# Patient Record
Sex: Female | Born: 1960
Health system: Southern US, Community
[De-identification: ages and names within clinical notes are randomized; demographics above are authoritative.]

## PROBLEM LIST (undated history)

## (undated) DIAGNOSIS — D229 Melanocytic nevi, unspecified: Secondary | ICD-10-CM

## (undated) DIAGNOSIS — F419 Anxiety disorder, unspecified: Secondary | ICD-10-CM

## (undated) DIAGNOSIS — I10 Essential (primary) hypertension: Secondary | ICD-10-CM

## (undated) DIAGNOSIS — E7800 Pure hypercholesterolemia, unspecified: Secondary | ICD-10-CM

## (undated) DIAGNOSIS — K219 Gastro-esophageal reflux disease without esophagitis: Secondary | ICD-10-CM

## (undated) HISTORY — PX: FRACTURE SURGERY: SHX138

## (undated) HISTORY — PX: CHOLECYSTECTOMY: SHX55

## (undated) HISTORY — DX: Melanocytic nevi, unspecified: D22.9

---

## 1997-08-19 ENCOUNTER — Ambulatory Visit (HOSPITAL_COMMUNITY): Admission: RE | Admit: 1997-08-19 | Discharge: 1997-08-19 | Payer: Self-pay | Admitting: Obstetrics and Gynecology

## 1997-09-24 ENCOUNTER — Other Ambulatory Visit: Admission: RE | Admit: 1997-09-24 | Discharge: 1997-09-24 | Payer: Self-pay | Admitting: *Deleted

## 1998-10-15 ENCOUNTER — Other Ambulatory Visit: Admission: RE | Admit: 1998-10-15 | Discharge: 1998-10-15 | Payer: Self-pay | Admitting: Obstetrics and Gynecology

## 1999-02-21 ENCOUNTER — Inpatient Hospital Stay (HOSPITAL_COMMUNITY): Admission: AD | Admit: 1999-02-21 | Discharge: 1999-02-21 | Payer: Self-pay | Admitting: Obstetrics & Gynecology

## 1999-04-02 ENCOUNTER — Inpatient Hospital Stay (HOSPITAL_COMMUNITY): Admission: AD | Admit: 1999-04-02 | Discharge: 1999-04-02 | Payer: Self-pay | Admitting: Obstetrics and Gynecology

## 1999-06-10 ENCOUNTER — Encounter (INDEPENDENT_AMBULATORY_CARE_PROVIDER_SITE_OTHER): Payer: Self-pay

## 1999-06-10 ENCOUNTER — Inpatient Hospital Stay (HOSPITAL_COMMUNITY): Admission: AD | Admit: 1999-06-10 | Discharge: 1999-06-13 | Payer: Self-pay | Admitting: Obstetrics and Gynecology

## 1999-08-03 ENCOUNTER — Other Ambulatory Visit: Admission: RE | Admit: 1999-08-03 | Discharge: 1999-08-03 | Payer: Self-pay | Admitting: Obstetrics and Gynecology

## 2000-02-28 ENCOUNTER — Emergency Department (HOSPITAL_COMMUNITY): Admission: EM | Admit: 2000-02-28 | Discharge: 2000-02-28 | Payer: Self-pay | Admitting: Emergency Medicine

## 2000-03-07 ENCOUNTER — Encounter: Admission: RE | Admit: 2000-03-07 | Discharge: 2000-03-07 | Payer: Self-pay | Admitting: Internal Medicine

## 2000-10-10 ENCOUNTER — Other Ambulatory Visit: Admission: RE | Admit: 2000-10-10 | Discharge: 2000-10-10 | Payer: Self-pay | Admitting: Obstetrics and Gynecology

## 2001-11-17 ENCOUNTER — Other Ambulatory Visit: Admission: RE | Admit: 2001-11-17 | Discharge: 2001-11-17 | Payer: Self-pay | Admitting: Obstetrics and Gynecology

## 2003-01-10 ENCOUNTER — Other Ambulatory Visit: Admission: RE | Admit: 2003-01-10 | Discharge: 2003-01-10 | Payer: Self-pay | Admitting: Obstetrics and Gynecology

## 2004-02-03 ENCOUNTER — Other Ambulatory Visit: Admission: RE | Admit: 2004-02-03 | Discharge: 2004-02-03 | Payer: Self-pay | Admitting: Obstetrics and Gynecology

## 2004-08-13 ENCOUNTER — Other Ambulatory Visit: Admission: RE | Admit: 2004-08-13 | Discharge: 2004-08-13 | Payer: Self-pay | Admitting: Obstetrics and Gynecology

## 2005-07-21 ENCOUNTER — Other Ambulatory Visit: Admission: RE | Admit: 2005-07-21 | Discharge: 2005-07-21 | Payer: Self-pay | Admitting: Obstetrics and Gynecology

## 2005-11-05 ENCOUNTER — Ambulatory Visit (HOSPITAL_COMMUNITY): Admission: RE | Admit: 2005-11-05 | Discharge: 2005-11-05 | Payer: Self-pay | Admitting: Family Medicine

## 2007-11-10 ENCOUNTER — Encounter: Admission: RE | Admit: 2007-11-10 | Discharge: 2007-11-10 | Payer: Self-pay | Admitting: Gastroenterology

## 2010-10-08 ENCOUNTER — Ambulatory Visit: Payer: 59 | Attending: Otolaryngology

## 2010-10-08 DIAGNOSIS — G4733 Obstructive sleep apnea (adult) (pediatric): Secondary | ICD-10-CM | POA: Insufficient documentation

## 2010-10-14 NOTE — Procedures (Signed)
Amanda Wheeler, Amanda Wheeler              ACCOUNT NO.:  [id number]  MEDICAL RECORD NO.:  [id number]          PATIENT TYPE:  OUT  LOCATION:  SLEEP LAB                     FACILITY:  [facility]  PHYSICIAN:  Kolette Vey A. Gerilyn Pilgrim, M.D.      DATE OF BIRTH:  DATE OF STUDY:  10/08/2010                           NOCTURNAL POLYSOMNOGRAM  REFERRING PHYSICIAN:  INDICATIONS:  This is a 51 year old  female who presents with hypersomnia, fatigue and snoring.  She has been diagnosed with obstructive sleep apnea syndrome.  This is a CPAP titration recording.  INDICATION FOR STUDY:  EPWORTH SLEEPINESS SCORE:  MEDICATIONS:  Metoprolol.  Epworth sleepiness scale 16.  BMI 25. Architecture summary:  The total recording time is 396 minutes.  Sleep efficiency 75%,  sleep latency 18 minutes.  REM latency 101 minutes.  Stage N1-5%, N2 63%, N317% and REM sleep 15%.  Respiratory: Baseline oxygen saturation 98, lowest saturation 90 during REM sleep.  The patient was started on CPAP with a pressure of 5 and titrated to a pressure of 12.  She did have difficulty going to sleep and awakening during the later part of the night.  She was subsequently placed on some bilevel pressures between 11/07 to 12/08.  She mostly had central events during this part of the recording however.  She optimally seemed to respond to a pressure of 11.  Limb movement summary:  PLM index 0,  electrocardiogram summary average heart rate 63 with no significant dysrhythmias observed.  IMPRESSION:  Obstructive sleep apnea syndrome which responds optimally to a pressure of 12.  SLEEP ARCHITECTURE:  RESPIRATORY DATA:  OXYGEN DATA:  CARDIAC DATA:  MOVEMENT-PARASOMNIA:  IMPRESSIONS-RECOMMENDATIONS:     Esaw Knippel A. Gerilyn Pilgrim, M.D. Electronically Signed 10/15/2010 10:46:38    KAD/MEDQ  D:  10/14/2010 09:34:57  T:  10/14/2010 10:39:26  Job:  [id number]

## 2010-10-30 NOTE — Op Note (Signed)
[facility]  Patient:    Amanda Wheeler                      MRN: [id number] Proc. Date: 06/10/99 Adm. Date:  [id number] Attending:  Cordelia Pen Ii                           Operative Report  PREOPERATIVE DIAGNOSES:       1. Intrauterine pregnancy at 37-1/2 weeks estimated                                  gestational age.                               2. Pregnancy-induced hypertension.                               3. History of cesarean section; desires repeat.                               4. Desires permanent sterilization.  POSTOPERATIVE DIAGNOSES:      1. Intrauterine pregnancy at 37-1/2 weeks estimated                                  gestational age.                               2. Pregnancy-induced hypertension.                               3. History of cesarean section; desires repeat.                               4. Desires permanent sterilization.  PROCEDURE:                    1. Repeat low transverse cesarean section.                               2. Bilateral tubal ligation.  SURGEON:                      Guy Sandifer. Arleta Creek, M.D.  ANESTHESIA:                   Spinal, Dorinda Hill T. Pamalee Leyden, M.D.  ESTIMATED BLOOD LOSS:         800 cc.  FINDINGS:                     Viable female infant, Apgars of 8/9 at one and five  minutes, respectively, birth weight 6 pounds 1 ounce, arterial cord pH pending.  INDICATIONS AND CONSENT:      This patient is a 50 year old married white female, G8, P2, Ab5, [facility] of July 02, 1999, consistent with 14-weeks ultrasound and last menstrual period and early exam.  Prenatal care was complicated by  history of a  stillbirth, history of trisomy 66 in a previous pregnancy and advanced maternal age with a normal amniocentesis.  Patient was noted to have increasingly labile blood pressures over the past two to three visits; blood pressure today is 136/100 in the office with trace protein and  negative for central nervous system symptoms.  No  epigastric tenderness as well.  Evaluation in the hospital revealed blood pressures of 110-130/70s-80s while at rest.  Fetal heart tones were reactive. Laboratories were within normal limits.  In view of the patients increasingly labile blood pressures at term, recommendation for delivery is made.  Cesarean section is discussed with possible risks and complications including, but not limited to, infection; bowel, bladder or ureteral damage; bleeding requiring transfusion of  blood products with possible transfusion reaction, HIV and hepatitis acquisition; DVT, PE and pneumonia.  Patient also requests tubal ligation.  Permanence of the procedure as well as failure rate and increased ectopic risk are discussed. All questions are answered.  DESCRIPTION OF PROCEDURE:     Patient is taken to the operating room where a spinal anesthetic is placed.  She is then placed in the dorsal supine position with a 5 degree left lateral wedge.  She is then prepped abdominally, a Foley catheter is placed in the bladder and she is draped in a sterile fashion.  After testing for adequate anesthesia, skin is entered through the scar of the previous Pfannenstiel incision and dissection is carried out in layers to the peritoneum.  Peritoneum is sharply entered and extended superiorly and inferiorly.  Vesicouterine peritoneum is taken down cephalolaterally, the bladder flap is developed and bladder flap s placed.  Uterus is incised in a low transverse manner and the uterine cavity is  entered bluntly with a pair of hemostats.  Uterine incision is extended cephalolaterally.  Artificial rupture of membranes for clear fluid is carried out. Vertex is delivered and the oro and nasopharynx are suctioned.  The remainder of the infant is delivered; good cry and tone are noted.  Cord is clamped and cut nd the infant is handed to the awaiting pediatric  team.  Placenta is manually delivered and sent to pathology.  Internal uterine contour is normal.  Uterus is then closed in a running-locking layer of 0 Monocryl suture.  A figure-of-eight  suture is placed in the left angle to obtain complete hemostasis.  The left fallopian tube is then identified from cornu to fimbria, grasped in its mid-ampullary portion with a Babcock clamp and then the intervening knuckle of tissue is doubly ligated with a 0 plain free tie.  The intervening knuckle of tube is then resected.  Monopolar cautery is then used only enough to obtain hemostasis. A similar procedure on the right tube is carried out.  Ovaries are normal bilaterally.  Uterus is normal in external contour.  Copious irrigation is carried out and all returns as clear.  Anterior peritoneum is closed in running fashion  with 0 Monocryl suture, which is also used to reapproximate the pyramidalis muscle in the midline.  The anterior rectus fascia is closed in running fashion with 0 PDS suture and the skin is closed with clips.  All sponge, instrument and needle counts are correct and the patient is transferred to the recovery room in stable condition.  DD:  06/10/99 TD:  06/11/99 Job: [id number]

## 2014-05-02 ENCOUNTER — Telehealth: Payer: Self-pay | Admitting: *Deleted

## 2014-05-02 NOTE — Telephone Encounter (Signed)
Opened in error

## 2015-09-16 ENCOUNTER — Other Ambulatory Visit: Payer: Self-pay | Admitting: Obstetrics and Gynecology

## 2015-09-16 DIAGNOSIS — R928 Other abnormal and inconclusive findings on diagnostic imaging of breast: Secondary | ICD-10-CM

## 2015-09-18 ENCOUNTER — Ambulatory Visit
Admission: RE | Admit: 2015-09-18 | Discharge: 2015-09-18 | Disposition: A | Discharge status: Home / Self Care | Payer: No Typology Code available for payment source | Source: Ambulatory Visit | Attending: Obstetrics and Gynecology | Admitting: Obstetrics and Gynecology

## 2015-09-18 DIAGNOSIS — R928 Other abnormal and inconclusive findings on diagnostic imaging of breast: Secondary | ICD-10-CM

## 2015-09-22 ENCOUNTER — Other Ambulatory Visit: Payer: Self-pay

## 2016-08-16 DIAGNOSIS — H1032 Unspecified acute conjunctivitis, left eye: Secondary | ICD-10-CM | POA: Diagnosis not present

## 2016-09-16 DIAGNOSIS — L570 Actinic keratosis: Secondary | ICD-10-CM | POA: Diagnosis not present

## 2016-09-16 DIAGNOSIS — D485 Neoplasm of uncertain behavior of skin: Secondary | ICD-10-CM | POA: Diagnosis not present

## 2016-09-16 DIAGNOSIS — D229 Melanocytic nevi, unspecified: Secondary | ICD-10-CM

## 2016-09-16 DIAGNOSIS — L219 Seborrheic dermatitis, unspecified: Secondary | ICD-10-CM | POA: Diagnosis not present

## 2016-09-16 HISTORY — DX: Melanocytic nevi, unspecified: D22.9

## 2016-09-22 DIAGNOSIS — I10 Essential (primary) hypertension: Secondary | ICD-10-CM | POA: Diagnosis not present

## 2016-09-22 DIAGNOSIS — R070 Pain in throat: Secondary | ICD-10-CM | POA: Diagnosis not present

## 2016-09-22 DIAGNOSIS — E782 Mixed hyperlipidemia: Secondary | ICD-10-CM | POA: Diagnosis not present

## 2016-11-30 DIAGNOSIS — Z01419 Encounter for gynecological examination (general) (routine) without abnormal findings: Secondary | ICD-10-CM | POA: Diagnosis not present

## 2016-12-09 DIAGNOSIS — H04123 Dry eye syndrome of bilateral lacrimal glands: Secondary | ICD-10-CM | POA: Diagnosis not present

## 2016-12-09 DIAGNOSIS — H43811 Vitreous degeneration, right eye: Secondary | ICD-10-CM | POA: Diagnosis not present

## 2016-12-09 DIAGNOSIS — H31011 Macula scars of posterior pole (postinflammatory) (post-traumatic), right eye: Secondary | ICD-10-CM | POA: Diagnosis not present

## 2017-01-14 DIAGNOSIS — I10 Essential (primary) hypertension: Secondary | ICD-10-CM | POA: Diagnosis not present

## 2017-01-14 DIAGNOSIS — Z79899 Other long term (current) drug therapy: Secondary | ICD-10-CM | POA: Diagnosis not present

## 2017-01-14 DIAGNOSIS — Z1389 Encounter for screening for other disorder: Secondary | ICD-10-CM | POA: Diagnosis not present

## 2017-04-06 DIAGNOSIS — Z23 Encounter for immunization: Secondary | ICD-10-CM | POA: Diagnosis not present

## 2017-09-06 DIAGNOSIS — N390 Urinary tract infection, site not specified: Secondary | ICD-10-CM | POA: Diagnosis not present

## 2017-09-06 DIAGNOSIS — R82998 Other abnormal findings in urine: Secondary | ICD-10-CM | POA: Diagnosis not present

## 2017-09-27 DIAGNOSIS — R3121 Asymptomatic microscopic hematuria: Secondary | ICD-10-CM | POA: Diagnosis not present

## 2017-09-27 DIAGNOSIS — R350 Frequency of micturition: Secondary | ICD-10-CM | POA: Diagnosis not present

## 2017-12-08 DIAGNOSIS — I10 Essential (primary) hypertension: Secondary | ICD-10-CM | POA: Diagnosis not present

## 2017-12-09 DIAGNOSIS — H43811 Vitreous degeneration, right eye: Secondary | ICD-10-CM | POA: Diagnosis not present

## 2017-12-09 DIAGNOSIS — H31011 Macula scars of posterior pole (postinflammatory) (post-traumatic), right eye: Secondary | ICD-10-CM | POA: Diagnosis not present

## 2017-12-21 DIAGNOSIS — G4733 Obstructive sleep apnea (adult) (pediatric): Secondary | ICD-10-CM | POA: Diagnosis not present

## 2017-12-23 DIAGNOSIS — G4730 Sleep apnea, unspecified: Secondary | ICD-10-CM | POA: Diagnosis not present

## 2018-01-05 DIAGNOSIS — Z01419 Encounter for gynecological examination (general) (routine) without abnormal findings: Secondary | ICD-10-CM | POA: Diagnosis not present

## 2018-01-12 DIAGNOSIS — G4733 Obstructive sleep apnea (adult) (pediatric): Secondary | ICD-10-CM | POA: Diagnosis not present

## 2018-01-20 DIAGNOSIS — G4733 Obstructive sleep apnea (adult) (pediatric): Secondary | ICD-10-CM | POA: Diagnosis not present

## 2018-02-20 DIAGNOSIS — G4733 Obstructive sleep apnea (adult) (pediatric): Secondary | ICD-10-CM | POA: Diagnosis not present

## 2018-03-02 DIAGNOSIS — G4733 Obstructive sleep apnea (adult) (pediatric): Secondary | ICD-10-CM | POA: Diagnosis not present

## 2018-03-22 DIAGNOSIS — G4733 Obstructive sleep apnea (adult) (pediatric): Secondary | ICD-10-CM | POA: Diagnosis not present

## 2018-04-03 DIAGNOSIS — Z23 Encounter for immunization: Secondary | ICD-10-CM | POA: Diagnosis not present

## 2018-05-16 DIAGNOSIS — D229 Melanocytic nevi, unspecified: Secondary | ICD-10-CM | POA: Diagnosis not present

## 2018-05-25 ENCOUNTER — Ambulatory Visit (HOSPITAL_COMMUNITY)
Admission: RE | Admit: 2018-05-25 | Discharge: 2018-05-25 | Disposition: A | Discharge status: Home / Self Care | Payer: 59 | Source: Ambulatory Visit | Attending: Family Medicine | Admitting: Family Medicine

## 2018-05-25 ENCOUNTER — Other Ambulatory Visit (HOSPITAL_COMMUNITY): Payer: Self-pay | Admitting: Family Medicine

## 2018-05-25 DIAGNOSIS — M25552 Pain in left hip: Secondary | ICD-10-CM

## 2018-05-25 DIAGNOSIS — M545 Low back pain, unspecified: Secondary | ICD-10-CM

## 2018-05-25 DIAGNOSIS — M4186 Other forms of scoliosis, lumbar region: Secondary | ICD-10-CM | POA: Diagnosis not present

## 2018-05-25 DIAGNOSIS — E663 Overweight: Secondary | ICD-10-CM | POA: Diagnosis not present

## 2018-05-25 DIAGNOSIS — Z6827 Body mass index (BMI) 27.0-27.9, adult: Secondary | ICD-10-CM | POA: Diagnosis not present

## 2018-05-26 DIAGNOSIS — M545 Low back pain: Secondary | ICD-10-CM | POA: Diagnosis not present

## 2018-06-08 DIAGNOSIS — Z681 Body mass index (BMI) 19 or less, adult: Secondary | ICD-10-CM | POA: Diagnosis not present

## 2018-06-08 DIAGNOSIS — B029 Zoster without complications: Secondary | ICD-10-CM | POA: Diagnosis not present

## 2018-06-12 DIAGNOSIS — Z1211 Encounter for screening for malignant neoplasm of colon: Secondary | ICD-10-CM | POA: Diagnosis not present

## 2018-09-06 ENCOUNTER — Encounter: Payer: Self-pay | Admitting: *Deleted

## 2018-11-01 DIAGNOSIS — H04123 Dry eye syndrome of bilateral lacrimal glands: Secondary | ICD-10-CM | POA: Diagnosis not present

## 2018-11-01 DIAGNOSIS — H43813 Vitreous degeneration, bilateral: Secondary | ICD-10-CM | POA: Diagnosis not present

## 2018-11-01 DIAGNOSIS — H31011 Macula scars of posterior pole (postinflammatory) (post-traumatic), right eye: Secondary | ICD-10-CM | POA: Diagnosis not present

## 2018-12-08 ENCOUNTER — Other Ambulatory Visit: Payer: 59

## 2018-12-08 ENCOUNTER — Other Ambulatory Visit: Payer: Self-pay | Admitting: Internal Medicine

## 2018-12-08 DIAGNOSIS — Z20822 Contact with and (suspected) exposure to covid-19: Secondary | ICD-10-CM

## 2018-12-14 LAB — NOVEL CORONAVIRUS, NAA: SARS-CoV-2, NAA: NOT DETECTED

## 2019-07-26 ENCOUNTER — Ambulatory Visit: Payer: 59 | Attending: Internal Medicine

## 2019-07-26 ENCOUNTER — Other Ambulatory Visit: Payer: Self-pay

## 2019-07-26 DIAGNOSIS — Z20822 Contact with and (suspected) exposure to covid-19: Secondary | ICD-10-CM

## 2019-07-27 LAB — NOVEL CORONAVIRUS, NAA

## 2019-08-19 ENCOUNTER — Ambulatory Visit: Payer: 59 | Attending: Internal Medicine

## 2019-08-19 DIAGNOSIS — Z23 Encounter for immunization: Secondary | ICD-10-CM

## 2019-08-19 NOTE — Progress Notes (Signed)
   Covid-19 Vaccination Clinic  Name:  Amanda Wheeler    MRN: [id number] DOB: 1961-05-21  08/19/2019  [name] was observed post Covid-19 immunization for 15 minutes without incident. She was provided with Vaccine Information Sheet and instruction to access the V-Safe system.   [name] was instructed to call 911 with any severe reactions post vaccine: Marland Kitchen Difficulty breathing  . Swelling of face and throat  . A fast heartbeat  . A bad rash all over body  . Dizziness and weakness   Immunizations Administered    Name Date Dose VIS Date Route   Pfizer COVID-19 Vaccine 08/19/2019  4:52 PM 0.3 mL 05/25/2019 Intramuscular   Manufacturer: [place]   Lot: [id number]   [place]: [id number]

## 2019-09-09 ENCOUNTER — Ambulatory Visit: Payer: 59 | Attending: Internal Medicine

## 2019-09-09 DIAGNOSIS — Z23 Encounter for immunization: Secondary | ICD-10-CM

## 2019-09-09 NOTE — Progress Notes (Signed)
   Covid-19 Vaccination Clinic  Name:  Amanda Wheeler    MRN: [id number] DOB: 01-11-1961  09/09/2019  [name] was observed post Covid-19 immunization for 15 minutes without incident. She was provided with Vaccine Information Sheet and instruction to access the V-Safe system.   [name] was instructed to call 911 with any severe reactions post vaccine: Marland Kitchen Difficulty breathing  . Swelling of face and throat  . A fast heartbeat  . A bad rash all over body  . Dizziness and weakness   Immunizations Administered    Name Date Dose VIS Date Route   Pfizer COVID-19 Vaccine 09/09/2019  3:03 PM 0.3 mL 05/25/2019 Intramuscular   Manufacturer: [place]   Lot: [id number]   [place]: [id number]

## 2020-01-22 ENCOUNTER — Other Ambulatory Visit: Payer: Self-pay | Admitting: Obstetrics and Gynecology

## 2020-01-22 DIAGNOSIS — R928 Other abnormal and inconclusive findings on diagnostic imaging of breast: Secondary | ICD-10-CM

## 2020-01-30 ENCOUNTER — Other Ambulatory Visit: Payer: Self-pay

## 2020-01-30 ENCOUNTER — Ambulatory Visit
Admission: RE | Admit: 2020-01-30 | Discharge: 2020-01-30 | Disposition: A | Discharge status: Home / Self Care | Payer: 59 | Source: Ambulatory Visit | Attending: Obstetrics and Gynecology | Admitting: Obstetrics and Gynecology

## 2020-01-30 DIAGNOSIS — R928 Other abnormal and inconclusive findings on diagnostic imaging of breast: Secondary | ICD-10-CM

## 2020-02-01 ENCOUNTER — Other Ambulatory Visit: Payer: 59

## 2020-06-11 ENCOUNTER — Ambulatory Visit: Payer: 59 | Admitting: Dermatology

## 2020-09-22 ENCOUNTER — Other Ambulatory Visit: Payer: Self-pay

## 2020-09-22 ENCOUNTER — Emergency Department (HOSPITAL_COMMUNITY)
Admission: EM | Admit: 2020-09-22 | Discharge: 2020-09-22 | Disposition: A | Discharge status: Home / Self Care | Payer: 59

## 2020-11-05 ENCOUNTER — Encounter (HOSPITAL_COMMUNITY): Payer: Self-pay

## 2020-11-05 ENCOUNTER — Other Ambulatory Visit: Payer: Self-pay

## 2020-11-05 ENCOUNTER — Ambulatory Visit (HOSPITAL_COMMUNITY): Payer: 59 | Attending: Orthopedic Surgery

## 2020-11-05 DIAGNOSIS — M25512 Pain in left shoulder: Secondary | ICD-10-CM | POA: Insufficient documentation

## 2020-11-05 DIAGNOSIS — M6281 Muscle weakness (generalized): Secondary | ICD-10-CM | POA: Insufficient documentation

## 2020-11-05 DIAGNOSIS — M25612 Stiffness of left shoulder, not elsewhere classified: Secondary | ICD-10-CM | POA: Insufficient documentation

## 2020-11-05 NOTE — Therapy (Signed)
[place], [place], [address] Phone: [phone]   Fax:  [phone]  Physical Therapy Evaluation  Patient Details  Name: Amanda Wheeler MRN: [id number] Date of Birth: 1960/12/22 Referring Provider (PT): Dr Norlene Duel PA-C   Encounter Date: 11/05/2020   PT End of Session - 11/05/20 1553    Visit Number 1    Number of Visits 12    Date for PT Re-Evaluation 12/17/20    Authorization Type [employer], no auth    Authorization - Visit Number 1    Authorization - Number of Visits 60    Progress Note Due on Visit 10    PT Start Time 1600    PT Stop Time 1639    PT Time Calculation (min) 39 min    Activity Tolerance Patient tolerated treatment well    Behavior During Therapy [facility] for tasks assessed/performed           Past Medical History:  Diagnosis Date  . Atypical nevus 09/16/2016   Left abdomen-mild    History reviewed. No pertinent surgical history.  There were no vitals filed for this visit.    Subjective Assessment - 11/05/20 1558    Subjective Fall on [address] onto left shoulder with subsequent fx and underwent ORIF on 09/23/20    Patient is accompained by: Family member   husband   Patient Stated Goals get use of LUE back    Currently in Pain? Yes    Pain Score 4     Pain Location Shoulder    Pain Orientation Left    Pain Descriptors / Indicators Aching    Pain Type Surgical pain    Aggravating Factors  with use of LUE              [facility] PT Assessment - 11/05/20 0001      Assessment   Medical Diagnosis Closed Fracture of Left Humerous with Routine Healing    Referring Provider (PT) Dr Norlene Duel PA-C    Onset Date/Surgical Date 09/23/20    Next MD Visit 3 months      Restrictions   Weight Bearing Restrictions Yes    LUE Weight Bearing Non weight bearing   unsure of WBing status at this time     Balance Screen   Has the patient fallen in the past 6  months Yes    How many times? 1    Has the patient had a decrease in activity level because of a fear of falling?  No    Is the patient reluctant to leave their home because of a fear of falling?  No      Prior Function   Level of Independence Independent    Vocation Full time employment    Geneticist, molecular    Leisure shop      Observation/Other Assessments   Focus on Therapeutic Outcomes (FOTO)  53% function      ROM / Strength   AROM / PROM / Strength AROM;PROM;Strength      AROM   AROM Assessment Site Shoulder    Right/Left Shoulder Left    Left Shoulder Extension 30 Degrees    Left Shoulder Flexion 148 Degrees    Left Shoulder ABduction 150 Degrees    Left Shoulder Internal Rotation --   can reach T8   Left Shoulder External Rotation 41 Degrees      Strength   Overall Strength Comments DNT  Palpation   Palpation comment scar tissue tightness along incision                      Objective measurements completed on examination: See above findings.               PT Education - 11/05/20 1636    Education Details HEP initiation, education on scar tissue mobilization    Person(s) Educated Patient    Methods Explanation    Comprehension Verbalized understanding            PT Short Term Goals - 11/05/20 1640      PT SHORT TERM GOAL #1   Title Patient will be independent with HEP in order to improve functional outcomes.    Time 3    Period Weeks    Status New    Target Date 11/26/20      PT SHORT TERM GOAL #2   Title Patient will report at least 25% improvement in symptoms for improved quality of life.    Time 3    Period Weeks    Status New    Target Date 11/26/20      PT SHORT TERM GOAL #3   Title Patient will demonstrate left shoulder flexion and abduction to 170 AROM to improve ADL tolerance    Baseline 148 and 150 respectively    Time 6    Period Weeks    Status New    Target Date 11/26/20              PT Long Term Goals - 11/05/20 1642      PT LONG TERM GOAL #1   Title Patient will improve FOTO score by at least 5 points in order to indicate improved tolerance to activity.    Baseline 53% function    Time 6    Period Weeks    Status New    Target Date 12/17/20      PT LONG TERM GOAL #2   Title Demo ability to lift and place 10 lbs overhead to improve functional status    Baseline DNT due to unknown LUE WBing status    Time 6    Period Weeks    Status New    Target Date 12/17/20                  Plan - 11/05/20 1637    Clinical Impression Statement Pt is 60 yo lady with limited left shoulder ROM, strength, and unknown WBing status at this time presenting with deficits in use of LUE and ROM limitations requiring comensation requiring PT services to improve LUE function to restore ROM and strength to enable independent ADL and usual level of activity    Personal Factors and Comorbidities Time since onset of injury/illness/exacerbation    Examination-Activity Limitations Bathing;Carry;Lift;Reach Overhead    Examination-Participation Restrictions Cleaning;Community Activity;Yard Work;Occupation    Stability/Clinical Decision Making Stable/Uncomplicated    Clinical Decision Making Low    Rehab Potential Excellent    PT Frequency 2x / week    PT Duration 6 weeks    PT Treatment/Interventions ADLs/Self Care Home Management;Aquatic Therapy;Cryotherapy;Electrical Stimulation;DME Instruction;Ultrasound;Moist Heat;Gait training;Stair training;Functional mobility training;Therapeutic activities;Therapeutic exercise;Balance training;Patient/family education;Neuromuscular re-education;Manual techniques;Passive range of motion;Taping;Energy conservation;Dry needling;Spinal Manipulations;Joint Manipulations    PT Next Visit Plan Continue with AAROM, AROM, wall slides, check on WBing status for LUE    PT Home Exercise Plan supine left shoulder AAROM with wand    Consulted and Agree  with  Plan of Care Patient           Patient will benefit from skilled therapeutic intervention in order to improve the following deficits and impairments:  Decreased range of motion,Decreased scar mobility,Decreased strength,Pain,Impaired UE functional use  Visit Diagnosis: Acute pain of left shoulder  Decreased range of motion of left shoulder  Muscle weakness (generalized)     Problem List There are no problems to display for this patient.  4:46 PM, 11/05/20 M. Sherlyn Lees, PT, DPT Physical Therapist- [place] Office Number: [phone]  [place], [place], [address] Phone: [phone]   Fax:  [phone]  Name: Amanda Wheeler MRN: [id number] Date of Birth: [birth year]

## 2020-11-05 NOTE — Patient Instructions (Signed)
Access Code: [email or web address] URL: [email or web address] Date: 11/05/2020 Prepared by: Sherlyn Lees  Exercises Supine Shoulder External Rotation in 45 Degrees Abduction AAROM with Dowel - 1 x daily - 7 x weekly - 3 sets - 10 reps - 5 sec hold Supine Shoulder Flexion Extension AAROM with Dowel - 1 x daily - 7 x weekly - 3 sets - 10 reps - 5 sec hold Supine Shoulder Abduction AAROM with Dowel - 1 x daily - 7 x weekly - 3 sets - 10 reps - 5 sec hold Standing Shoulder Extension with Dowel - 1 x daily - 7 x weekly - 3 sets - 10 reps - 5 sec hold

## 2020-11-17 ENCOUNTER — Telehealth (HOSPITAL_COMMUNITY): Payer: Self-pay | Admitting: Physical Therapy

## 2020-11-17 NOTE — Telephone Encounter (Signed)
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

## 2020-11-18 ENCOUNTER — Encounter (HOSPITAL_COMMUNITY): Payer: 59 | Admitting: Physical Therapy

## 2020-11-20 ENCOUNTER — Encounter (HOSPITAL_COMMUNITY): Payer: 59 | Admitting: Physical Therapy

## 2020-11-24 ENCOUNTER — Encounter (HOSPITAL_COMMUNITY): Payer: Self-pay

## 2020-11-24 ENCOUNTER — Other Ambulatory Visit: Payer: Self-pay

## 2020-11-24 ENCOUNTER — Ambulatory Visit (HOSPITAL_COMMUNITY): Payer: 59 | Attending: Orthopedic Surgery

## 2020-11-24 DIAGNOSIS — M25612 Stiffness of left shoulder, not elsewhere classified: Secondary | ICD-10-CM | POA: Diagnosis present

## 2020-11-24 DIAGNOSIS — M25512 Pain in left shoulder: Secondary | ICD-10-CM

## 2020-11-24 DIAGNOSIS — M6281 Muscle weakness (generalized): Secondary | ICD-10-CM | POA: Insufficient documentation

## 2020-11-24 NOTE — Therapy (Signed)
[place], [place], [address] Phone: [phone]   Fax:  [phone]  Physical Therapy Treatment  Patient Details  Name: Amanda Wheeler MRN: [id number] Date of Birth: 01/09/1961 Referring Provider (PT): Dr Norlene Duel PA-C   Encounter Date: 11/24/2020   PT End of Session - 11/24/20 1712     Visit Number 2    Number of Visits 12    Date for PT Re-Evaluation 12/17/20    Authorization Type [employer], no auth    Authorization - Visit Number 2    Authorization - Number of Visits 60    Progress Note Due on Visit 10    PT Start Time 1703   late arrival   PT Stop Time 1730    PT Time Calculation (min) 27 min    Activity Tolerance Patient tolerated treatment well    Behavior During Therapy [facility] for tasks assessed/performed             Past Medical History:  Diagnosis Date   Atypical nevus 09/16/2016   Left abdomen-mild    History reviewed. No pertinent surgical history.  There were no vitals filed for this visit.   Subjective Assessment - 11/24/20 1710     Subjective Shoulder feels sore especially with motions of reaching behind the back and closing doors                               [facility] Adult PT Treatment/Exercise - 11/24/20 0001       Exercises   Exercises Shoulder      Shoulder Exercises: Supine   External Rotation AAROM;Left;20 reps    Flexion AAROM;Both;20 reps    ABduction AAROM;Left;20 reps      Shoulder Exercises: Standing   Flexion AAROM;Both;20 reps    Flexion Limitations wall wash    Extension Strengthening;Left;20 reps;Theraband    Theraband Level (Shoulder Extension) Level 2 (Red)    Row Strengthening;Left;20 reps;Theraband    Theraband Level (Shoulder Row) Level 2 (Red)    Other Standing Exercises bicep curls 3 lbs 2x10                      PT Short Term Goals - 11/05/20 1640       PT SHORT TERM GOAL #1   Title  Patient will be independent with HEP in order to improve functional outcomes.    Time 3    Period Weeks    Status New    Target Date 11/26/20      PT SHORT TERM GOAL #2   Title Patient will report at least 25% improvement in symptoms for improved quality of life.    Time 3    Period Weeks    Status New    Target Date 11/26/20      PT SHORT TERM GOAL #3   Title Patient will demonstrate left shoulder flexion and abduction to 170 AROM to improve ADL tolerance    Baseline 148 and 150 respectively    Time 6    Period Weeks    Status New    Target Date 11/26/20               PT Long Term Goals - 11/05/20 1642       PT LONG TERM GOAL #1   Title Patient will improve FOTO score by at least 5 points in order to  indicate improved tolerance to activity.    Baseline 53% function    Time 6    Period Weeks    Status New    Target Date 12/17/20      PT LONG TERM GOAL #2   Title Demo ability to lift and place 10 lbs overhead to improve functional status    Baseline DNT due to unknown LUE WBing status    Time 6    Period Weeks    Status New    Target Date 12/17/20                   Plan - 11/24/20 1735     Clinical Impression Statement Progressing very well with AROM and AAROM activities for left shoulder with chief complaint being soreness in her upper arm.  Initiate open chain PRE with red resistance band to progress strength and endurance to improve LUE function.  Will call MD office again for [facility] clarification    Personal Factors and Comorbidities Time since onset of injury/illness/exacerbation    Examination-Activity Limitations Bathing;Carry;Lift;Reach Overhead    Examination-Participation Restrictions Cleaning;Community Activity;Yard Work;Occupation    Stability/Clinical Decision Making Stable/Uncomplicated    Rehab Potential Excellent    PT Frequency 2x / week    PT Duration 6 weeks    PT Treatment/Interventions ADLs/Self Care Home Management;Aquatic  Therapy;Cryotherapy;Electrical Stimulation;DME Instruction;Ultrasound;Moist Heat;Gait training;Stair training;Functional mobility training;Therapeutic activities;Therapeutic exercise;Balance training;Patient/family education;Neuromuscular re-education;Manual techniques;Passive range of motion;Taping;Energy conservation;Dry needling;Spinal Manipulations;Joint Manipulations    PT Next Visit Plan Continue with AAROM, AROM, wall slides, check on WBing status for LUE    PT Home Exercise Plan supine left shoulder AAROM with wand, red t-band open chain    Consulted and Agree with Plan of Care Patient             Patient will benefit from skilled therapeutic intervention in order to improve the following deficits and impairments:  Decreased range of motion, Decreased scar mobility, Decreased strength, Pain, Impaired UE functional use  Visit Diagnosis: Acute pain of left shoulder  Decreased range of motion of left shoulder  Muscle weakness (generalized)     Problem List There are no problems to display for this patient.  5:37 PM, 11/24/20 M. Sherlyn Lees, PT, DPT Physical Therapist- [place] Office Number: [phone]   [place], [place], [address] Phone: [phone]   Fax:  [phone]  Name: Amanda Wheeler MRN: [id number] Date of Birth: Jul 09, 1960

## 2020-11-24 NOTE — Patient Instructions (Signed)
Access Code: [email or web address] URL: [email or web address] Date: 11/24/2020 Prepared by: Sherlyn Lees  Exercises Standing Single Arm Bicep Curls Supinated with Dumbbell - 1 x daily - 7 x weekly - 3 sets - 10 reps Standing shoulder flexion wall slides - 1 x daily - 7 x weekly - 3 sets - 10 reps Shoulder External Rotation and Scapular Retraction with Resistance - 1 x daily - 7 x weekly - 3 sets - 10 reps Single Arm Shoulder Extension with Anchored Resistance - 1 x daily - 7 x weekly - 3 sets - 10 reps Standing Single Arm Row with Resistance Thumb Up - 1 x daily - 7 x weekly - 3 sets - 10 reps

## 2020-11-26 ENCOUNTER — Encounter (HOSPITAL_COMMUNITY): Payer: 59

## 2020-12-01 ENCOUNTER — Encounter (HOSPITAL_COMMUNITY): Payer: 59

## 2020-12-03 ENCOUNTER — Encounter (HOSPITAL_COMMUNITY): Payer: 59

## 2020-12-09 ENCOUNTER — Encounter (HOSPITAL_COMMUNITY): Payer: Self-pay

## 2020-12-09 ENCOUNTER — Ambulatory Visit (HOSPITAL_COMMUNITY): Payer: 59

## 2020-12-09 NOTE — Therapy (Signed)
[place], [place], [address] Phone: [phone]   Fax:  [phone]  Patient Details  Name: Amanda Wheeler MRN: [id number] Date of Birth: Jul 20, 1960 Referring Provider:  No ref. provider found  Encounter Date: 12/09/2020 PHYSICAL THERAPY DISCHARGE SUMMARY  Visits from Start of Care: 2  Current functional level related to goals / functional outcomes: Progressing with POC details, pt not returning after last visit due to leaving town   Remaining deficits: Unable to assess   Education / Equipment: HEP initiated    Patient agrees to discharge. Patient goals were not met. Patient is being discharged due to the patient's request.   3:27 PM, 12/09/20 M. Sherlyn Lees, PT, DPT Physical Therapist- [place] Office Number: [phone]   [place] [address] [place], [place], [address] Phone: [phone]   Fax:  [phone]

## 2020-12-12 ENCOUNTER — Ambulatory Visit (HOSPITAL_COMMUNITY): Payer: 59

## 2020-12-16 ENCOUNTER — Ambulatory Visit (HOSPITAL_COMMUNITY): Payer: 59

## 2020-12-19 ENCOUNTER — Encounter (HOSPITAL_COMMUNITY): Payer: 59

## 2020-12-23 ENCOUNTER — Encounter (HOSPITAL_COMMUNITY): Payer: 59

## 2020-12-26 ENCOUNTER — Encounter (HOSPITAL_COMMUNITY): Payer: 59

## 2021-02-12 IMAGING — MG MM DIGITAL DIAGNOSTIC UNILAT*L* W/ TOMO W/ CAD
4 series · 4 of 12 positions shown · non-contrast
Comparison: Previous exam(s).

CLINICAL DATA: Recall from screening to evaluate a possible left
breast asymmetry.

EXAM:
DIGITAL DIAGNOSTIC left MAMMOGRAM WITH TOMO
ULTRASOUND left BREAST

[L MLO synth-2D]
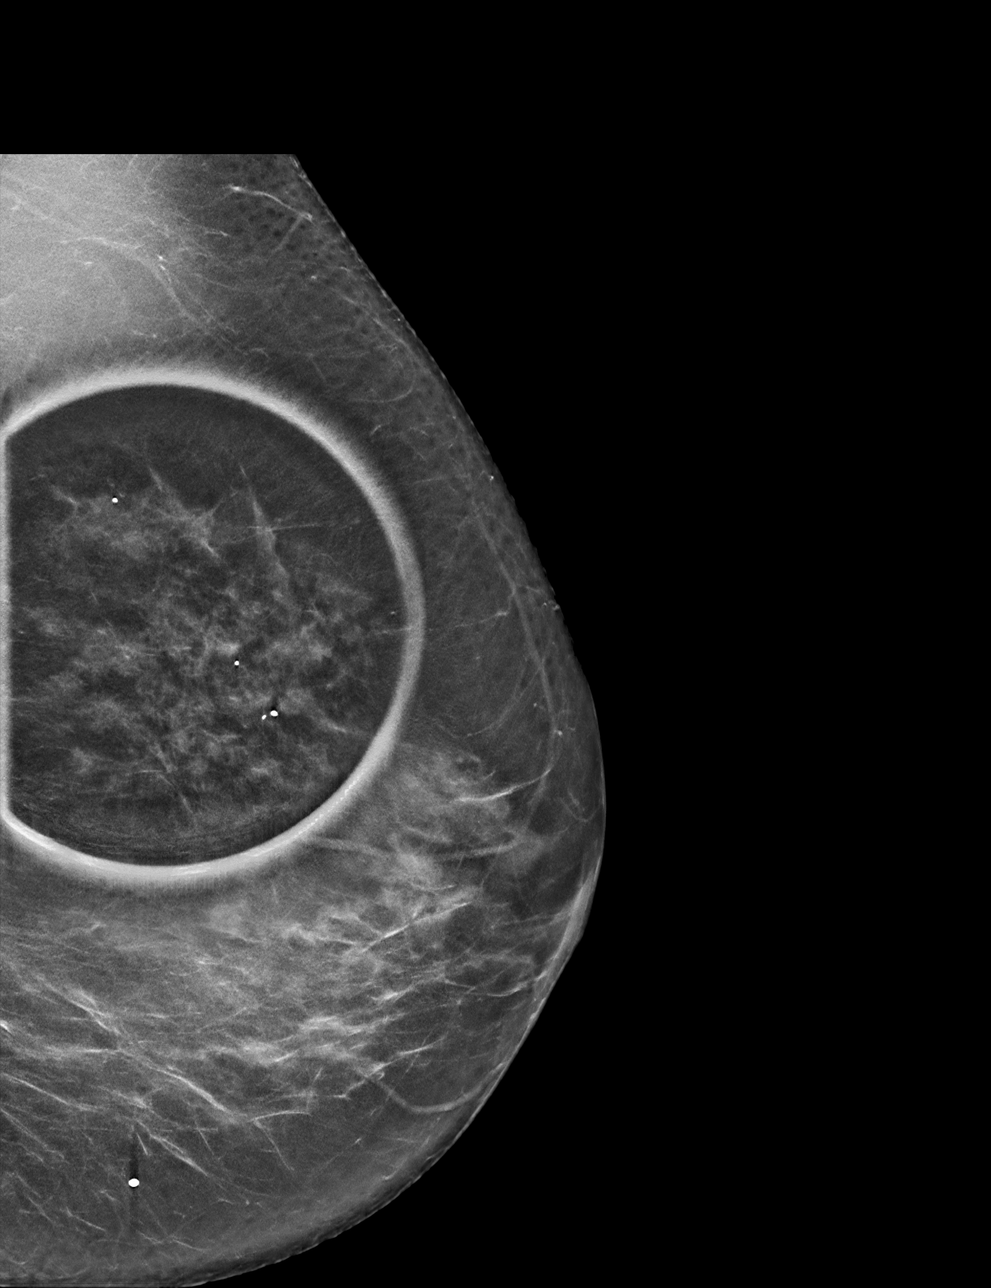

[L ML synth-2D]
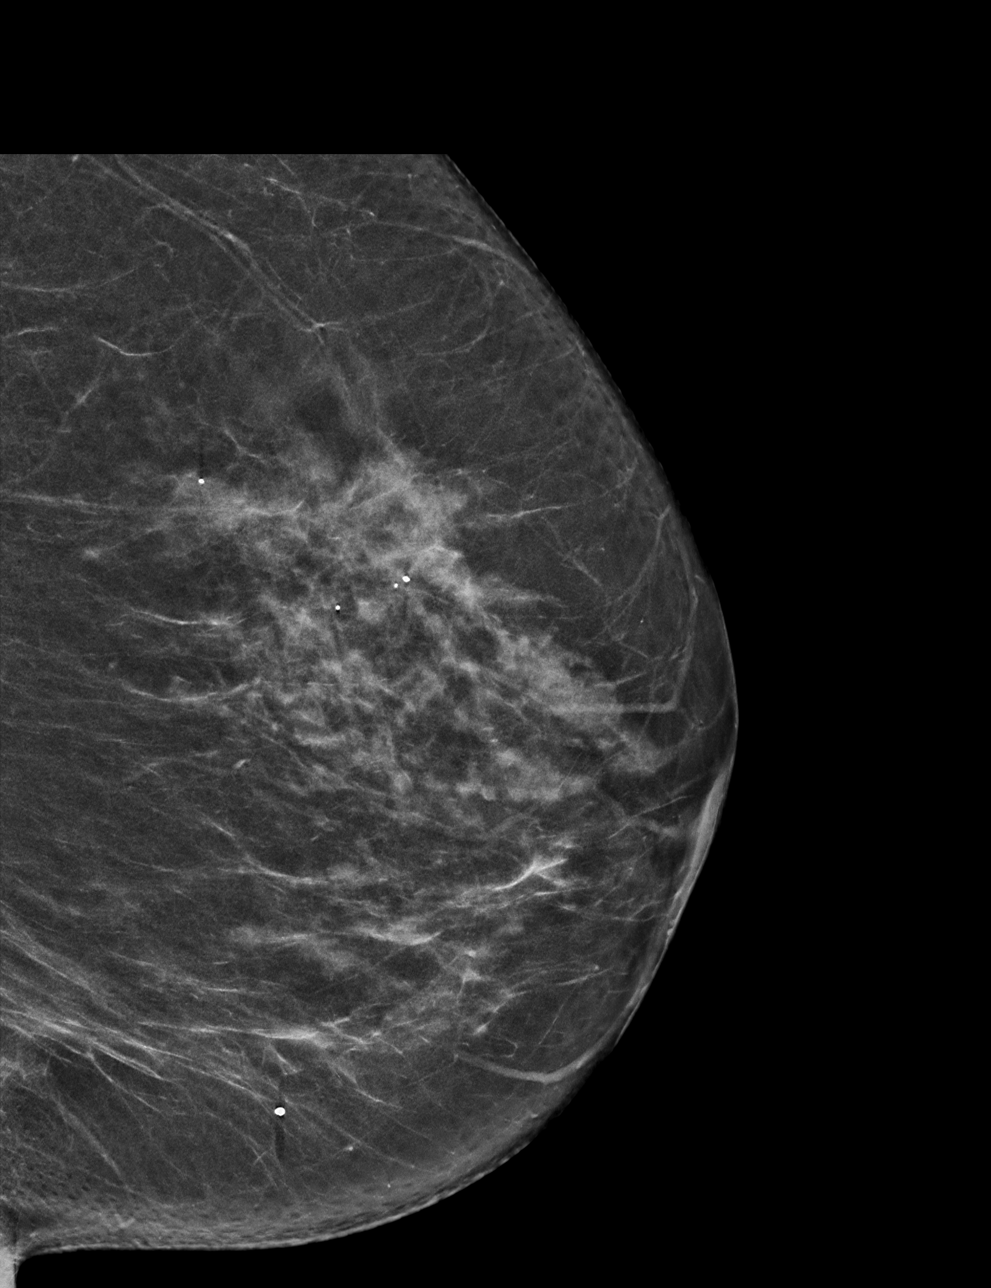

[L MLO tomo · tomo slice 33/66.0]
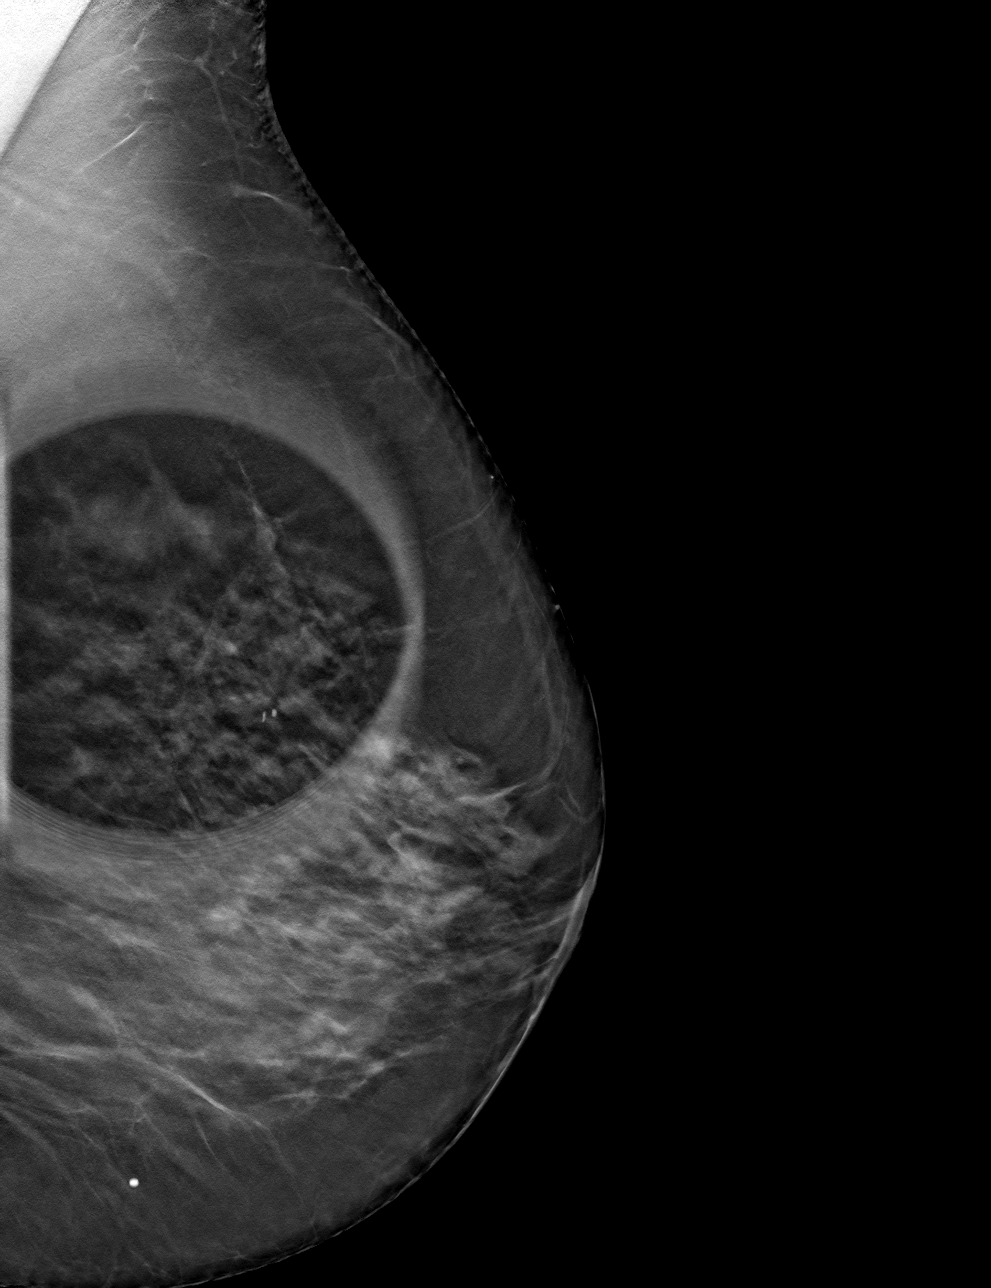

[L ML tomo · tomo slice 34/67.0]
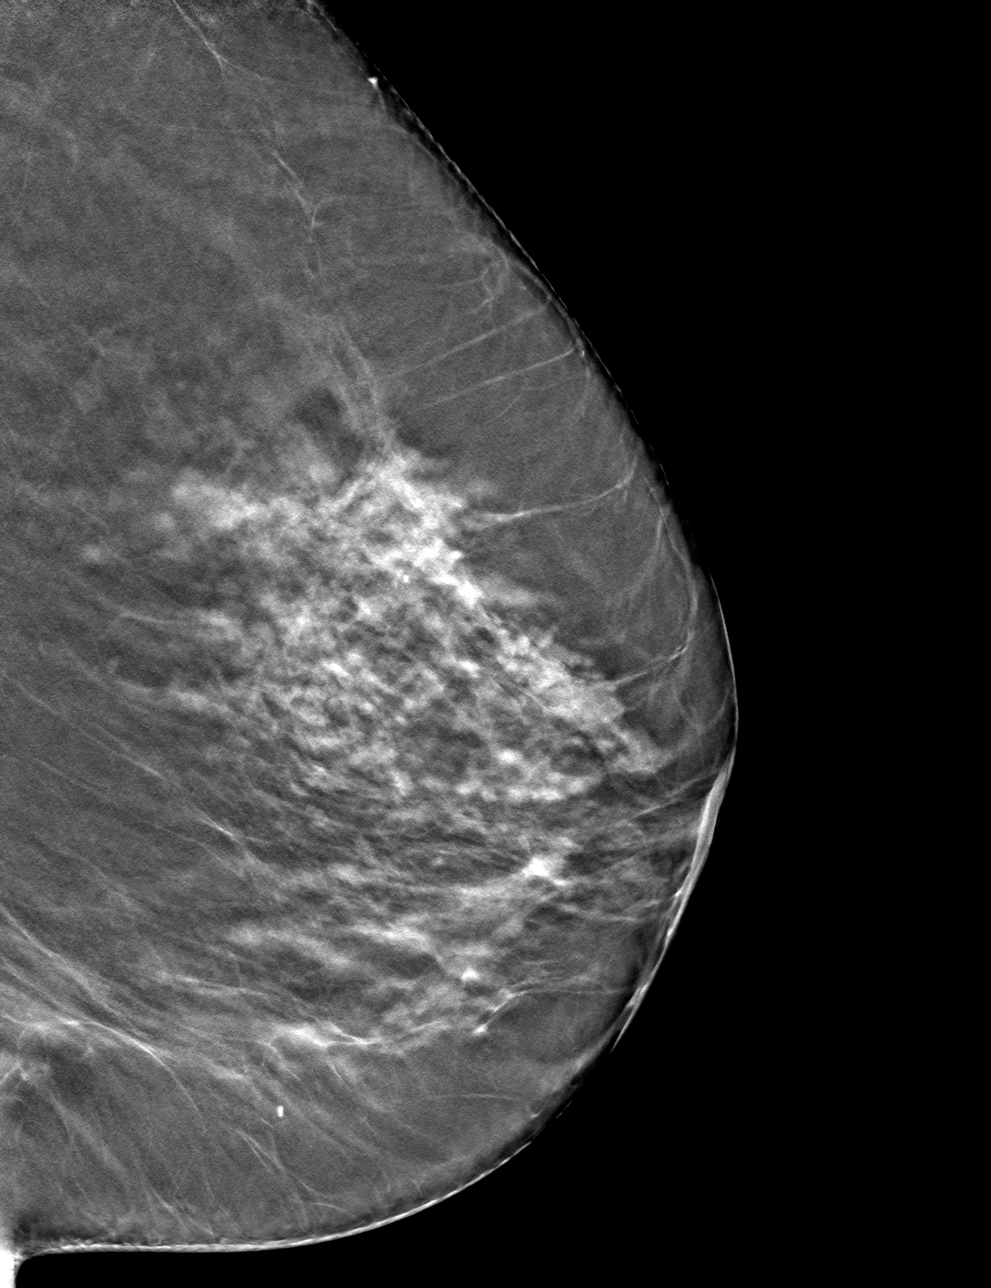

[4 of 12 positions shown; findings below may reference images not displayed]

ACR Breast Density Category c: The breast tissue is heterogeneously
dense, which may obscure small masses.
FINDINGS: Additional spot compression MLO and true lateral images demonstrate
no definite focal mass over the upper left breast.

Targeted ultrasound is performed, showing no focal abnormality over
the upper outer left breast from the 12 o'clock to the 3 o'clock
position.
IMPRESSION: No focal abnormality over the upper left breast.

RECOMMENDATION:
Recommend continued annual bilateral screening mammographic
follow-up.

I have discussed the findings and recommendations with the patient.
If applicable, a reminder letter will be sent to the patient
regarding the next appointment.

BI-RADS CATEGORY  1: Negative.

## 2023-06-07 ENCOUNTER — Other Ambulatory Visit: Payer: Self-pay

## 2023-06-07 ENCOUNTER — Encounter: Payer: Self-pay | Admitting: Emergency Medicine

## 2023-06-07 ENCOUNTER — Ambulatory Visit
Admission: EM | Admit: 2023-06-07 | Discharge: 2023-06-07 | Disposition: A | Discharge status: Home / Self Care | Payer: 59

## 2023-06-07 DIAGNOSIS — J208 Acute bronchitis due to other specified organisms: Secondary | ICD-10-CM

## 2023-06-07 HISTORY — DX: Essential (primary) hypertension: I10

## 2023-06-07 HISTORY — DX: Anxiety disorder, unspecified: F41.9

## 2023-06-07 HISTORY — DX: Gastro-esophageal reflux disease without esophagitis: K21.9

## 2023-06-07 HISTORY — DX: Pure hypercholesterolemia, unspecified: E78.00

## 2023-06-07 LAB — POC COVID19/FLU A&B COMBO
Covid Antigen, POC: NEGATIVE
Influenza A Antigen, POC: NEGATIVE
Influenza B Antigen, POC: NEGATIVE

## 2023-06-07 MED ORDER — PROMETHAZINE-DM 6.25-15 MG/5ML PO SYRP: 5.0000 mL | ORAL_SOLUTION | Freq: Four times a day (QID) | ORAL | 0 refills | Status: AC | PRN

## 2023-06-07 MED ORDER — ALBUTEROL SULFATE HFA 108 (90 BASE) MCG/ACT IN AERS: 2.0000 | INHALATION_SPRAY | RESPIRATORY_TRACT | 0 refills | Status: AC | PRN

## 2023-06-07 MED ORDER — DEXAMETHASONE SODIUM PHOSPHATE 10 MG/ML IJ SOLN
10.0000 mg | Freq: Once | INTRAMUSCULAR | Status: AC
  Administered 2023-06-07: 10 mg via INTRAMUSCULAR

## 2023-06-07 NOTE — ED Provider Notes (Signed)
[facility]    CSN: [id number] Arrival date & time: 06/07/23  1045      History   Chief Complaint Chief Complaint  Patient presents with   Cough    HPI Amanda Wheeler is a 62 y.o. female.   Presenting today with 5-day history of diarrhea, cough, body aches, congestion, scratchy throat, intermittent fevers.  Denies chest pain, shortness of breath, abdominal pain, vomiting, rashes.  So far trying over-the-counter remedies with minimal relief.  No known history of chronic pulmonary disease.  Numerous sick contacts recently.    Past Medical History:  Diagnosis Date   Acid reflux    Anxiety    Atypical nevus 09/16/2016   Left abdomen-mild   High cholesterol    Hypertension     There are no active problems to display for this patient.   Past Surgical History:  Procedure Laterality Date   CESAREAN SECTION     CHOLECYSTECTOMY     FRACTURE SURGERY Left    left arm    OB History   No obstetric history on file.      Home Medications    Prior to Admission medications   Medication Sig Start Date End Date Taking? Authorizing Provider  albuterol (VENTOLIN HFA) 108 (90 Base) MCG/ACT inhaler Inhale 2 puffs into the lungs every 4 (four) hours as needed. 06/07/23  Yes Particia Nearing, PA-C  atorvastatin (LIPITOR) 10 MG tablet Take 1 tablet by mouth daily. 03/28/23  Yes [provider]  FLUoxetine (PROZAC) 20 MG capsule Take 1 capsule by mouth daily. 08/07/20  Yes [provider]  metoprolol succinate (TOPROL-XL) 50 MG 24 hr tablet Take 1 tablet by mouth daily. 08/08/20  Yes [provider]  promethazine-dextromethorphan (PROMETHAZINE-DM) 6.25-15 MG/5ML syrup Take 5 mLs by mouth 4 (four) times daily as needed. 06/07/23  Yes Particia Nearing, PA-C  pantoprazole (PROTONIX) 40 MG tablet Take 1 tablet by mouth as needed.    [provider]    Family History History reviewed. No pertinent family history.  Social  History Social History   Tobacco Use   Smoking status: Never   Smokeless tobacco: Never  Substance Use Topics   Alcohol use: Never     Allergies   Patient has no known allergies.   Review of Systems Review of Systems Per HPI  Physical Exam Triage Vital Signs ED Triage Vitals  Encounter Vitals Group     BP 06/07/23 1122 120/79     Systolic BP Percentile --      Diastolic BP Percentile --      Pulse Rate 06/07/23 1122 80     Resp 06/07/23 1122 20     Temp 06/07/23 1122 98.1 F (36.7 C)     Temp Source 06/07/23 1122 Oral     SpO2 06/07/23 1122 97 %     Weight --      Height --      Head Circumference --      Peak Flow --      Pain Score 06/07/23 1123 4     Pain Loc --      Pain Education --      Exclude from Growth Chart --    No data found.  Updated Vital Signs BP 120/79 (BP Location: Right Arm)   Pulse 80   Temp 98.1 F (36.7 C) (Oral)   Resp 20   SpO2 97%   Visual Acuity Right Eye Distance:   Left Eye Distance:  Bilateral Distance:    Right Eye Near:   Left Eye Near:    Bilateral Near:     Physical Exam Vitals and nursing note reviewed.  Constitutional:      Appearance: Normal appearance.  HENT:     Head: Atraumatic.     Right Ear: Tympanic membrane and external ear normal.     Left Ear: Tympanic membrane and external ear normal.     Nose: Congestion present.     Mouth/Throat:     Mouth: Mucous membranes are moist.     Pharynx: Posterior oropharyngeal erythema present.  Eyes:     Extraocular Movements: Extraocular movements intact.     Conjunctiva/sclera: Conjunctivae normal.  Cardiovascular:     Rate and Rhythm: Normal rate and regular rhythm.     Heart sounds: Normal heart sounds.  Pulmonary:     Effort: Pulmonary effort is normal.     Breath sounds: Normal breath sounds. No wheezing or rales.  Musculoskeletal:        General: Normal range of motion.     Cervical back: Normal range of motion and neck supple.  Skin:    General:  Skin is warm and dry.  Neurological:     Mental Status: She is alert and oriented to person, place, and time.  Psychiatric:        Mood and Affect: Mood normal.        Thought Content: Thought content normal.      UC Treatments / Results  Labs (all labs ordered are listed, but only abnormal results are displayed) Labs Reviewed  POC COVID19/FLU A&B COMBO    EKG   Radiology No results found.  Procedures Procedures (including critical care time)  Medications Ordered in UC Medications  dexamethasone (DECADRON) injection 10 mg (10 mg Intramuscular Given 06/07/23 1206)    Initial Impression / Assessment and Plan / UC Course  I have reviewed the triage vital signs and the nursing notes.  Pertinent labs & imaging results that were available during my care of the patient were reviewed by me and considered in my medical decision making (see chart for details).     Vitals and exam reassuring, suspect viral bronchitis.  She is requesting steroid shot breath of the prednisone pills to IM Decadron given in clinic and Phenergan a.m., albuterol sent.  Supportive home care and return precautions reviewed.  Final diagnoses:  Viral bronchitis   Discharge Instructions   None    ED Prescriptions     Medication Sig Dispense Auth. Provider   albuterol (VENTOLIN HFA) 108 (90 Base) MCG/ACT inhaler Inhale 2 puffs into the lungs every 4 (four) hours as needed. 18 g Roosvelt Maser [place], [place]   promethazine-dextromethorphan (PROMETHAZINE-DM) 6.25-15 MG/5ML syrup Take 5 mLs by mouth 4 (four) times daily as needed. 100 mL Particia Nearing, [place]      PDMP not reviewed this encounter.   Particia Nearing, [place] 06/07/23 1232

## 2023-06-07 NOTE — ED Triage Notes (Signed)
Pt reports cough, diarrhea, body aches since Friday. Reports intermittent fever.

## 2023-07-19 DIAGNOSIS — H2513 Age-related nuclear cataract, bilateral: Secondary | ICD-10-CM | POA: Diagnosis not present

## 2023-07-19 DIAGNOSIS — H5213 Myopia, bilateral: Secondary | ICD-10-CM | POA: Diagnosis not present

## 2023-07-19 DIAGNOSIS — H43813 Vitreous degeneration, bilateral: Secondary | ICD-10-CM | POA: Diagnosis not present

## 2023-07-19 DIAGNOSIS — H31001 Unspecified chorioretinal scars, right eye: Secondary | ICD-10-CM | POA: Diagnosis not present

## 2023-07-20 ENCOUNTER — Other Ambulatory Visit: Payer: Self-pay | Admitting: Gastroenterology

## 2023-07-20 DIAGNOSIS — K315 Obstruction of duodenum: Secondary | ICD-10-CM

## 2023-07-20 DIAGNOSIS — R195 Other fecal abnormalities: Secondary | ICD-10-CM | POA: Diagnosis not present

## 2023-07-20 DIAGNOSIS — R194 Change in bowel habit: Secondary | ICD-10-CM | POA: Diagnosis not present

## 2023-08-07 ENCOUNTER — Ambulatory Visit
Admission: EM | Admit: 2023-08-07 | Discharge: 2023-08-07 | Disposition: A | Discharge status: Home / Self Care | Payer: Self-pay | Attending: Family Medicine | Admitting: Family Medicine

## 2023-08-07 ENCOUNTER — Other Ambulatory Visit: Payer: Self-pay

## 2023-08-07 ENCOUNTER — Encounter: Payer: Self-pay | Admitting: Emergency Medicine

## 2023-08-07 DIAGNOSIS — J069 Acute upper respiratory infection, unspecified: Secondary | ICD-10-CM | POA: Diagnosis not present

## 2023-08-07 LAB — POC COVID19/FLU A&B COMBO
Covid Antigen, POC: NEGATIVE
Influenza A Antigen, POC: NEGATIVE
Influenza B Antigen, POC: NEGATIVE

## 2023-08-07 MED ORDER — PROMETHAZINE-DM 6.25-15 MG/5ML PO SYRP: 5.0000 mL | ORAL_SOLUTION | Freq: Four times a day (QID) | ORAL | 0 refills | Status: AC | PRN

## 2023-08-07 MED ORDER — FLUTICASONE PROPIONATE 50 MCG/ACT NA SUSP
1.0000 | Freq: Two times a day (BID) | NASAL | 2 refills | Status: AC
Start: 2023-08-07 — End: ?

## 2023-08-07 MED ORDER — OSELTAMIVIR PHOSPHATE 75 MG PO CAPS: 75.0000 mg | ORAL_CAPSULE | Freq: Two times a day (BID) | ORAL | 0 refills | Status: AC

## 2023-08-07 NOTE — ED Provider Notes (Signed)
 [facility]    CSN: [id number] Arrival date & time: 08/07/23  1137      History   Chief Complaint Chief Complaint  Patient presents with   Sore Throat    HPI Amanda Wheeler is a 63 y.o. female.   Presenting today with several day history of cough, sore throat, sneezing, body aches, chills, fatigue.  Denies chest pain, shortness of breath, abdominal pain, vomiting, diarrhea.  So far trying over-the-counter remedies with minimal relief.  No known history of chronic pulmonary disease.    Past Medical History:  Diagnosis Date   Acid reflux    Anxiety    Atypical nevus 09/16/2016   Left abdomen-mild   High cholesterol    Hypertension     There are no active problems to display for this patient.   Past Surgical History:  Procedure Laterality Date   CESAREAN SECTION     CHOLECYSTECTOMY     FRACTURE SURGERY Left    left arm    OB History   No obstetric history on file.      Home Medications    Prior to Admission medications   Medication Sig Start Date End Date Taking? Authorizing Provider  fluticasone (FLONASE) 50 MCG/ACT nasal spray Place 1 spray into both nostrils 2 (two) times daily. 08/07/23  Yes Particia Nearing, PA-C  oseltamivir (TAMIFLU) 75 MG capsule Take 1 capsule (75 mg total) by mouth every 12 (twelve) hours. 08/07/23  Yes Particia Nearing, PA-C  promethazine-dextromethorphan (PROMETHAZINE-DM) 6.25-15 MG/5ML syrup Take 5 mLs by mouth 4 (four) times daily as needed. 08/07/23  Yes Particia Nearing, PA-C  albuterol (VENTOLIN HFA) 108 (90 Base) MCG/ACT inhaler Inhale 2 puffs into the lungs every 4 (four) hours as needed. 06/07/23   Particia Nearing, PA-C  atorvastatin (LIPITOR) 10 MG tablet Take 1 tablet by mouth daily. 03/28/23   [provider]  FLUoxetine (PROZAC) 20 MG capsule Take 1 capsule by mouth daily. 08/07/20   [provider]  metoprolol succinate (TOPROL-XL) 50 MG 24 hr tablet Take 1 tablet by  mouth daily. 08/08/20   [provider]  pantoprazole (PROTONIX) 40 MG tablet Take 1 tablet by mouth as needed.    [provider]  promethazine-dextromethorphan (PROMETHAZINE-DM) 6.25-15 MG/5ML syrup Take 5 mLs by mouth 4 (four) times daily as needed. 06/07/23   Particia Nearing, PA-C    Family History History reviewed. No pertinent family history.  Social History Social History   Tobacco Use   Smoking status: Never   Smokeless tobacco: Never  Substance Use Topics   Alcohol use: Never     Allergies   Patient has no known allergies.   Review of Systems Review of Systems PER HPI  Physical Exam Triage Vital Signs ED Triage Vitals [08/07/23 1205]  Encounter Vitals Group     BP 123/80     Systolic BP Percentile      Diastolic BP Percentile      Pulse Rate 71     Resp 20     Temp 98.8 F (37.1 C)     Temp Source Oral     SpO2 96 %     Weight      Height      Head Circumference      Peak Flow      Pain Score 5     Pain Loc      Pain Education      Exclude from Growth Chart  No data found.  Updated Vital Signs BP 123/80 (BP Location: Right Arm)   Pulse 71   Temp 98.8 F (37.1 C) (Oral)   Resp 20   SpO2 96%   Visual Acuity Right Eye Distance:   Left Eye Distance:   Bilateral Distance:    Right Eye Near:   Left Eye Near:    Bilateral Near:     Physical Exam Vitals and nursing note reviewed.  Constitutional:      Appearance: Normal appearance.  HENT:     Head: Atraumatic.     Right Ear: Tympanic membrane and external ear normal.     Left Ear: Tympanic membrane and external ear normal.     Nose: Rhinorrhea present.     Mouth/Throat:     Mouth: Mucous membranes are moist.     Pharynx: Posterior oropharyngeal erythema present.  Eyes:     Extraocular Movements: Extraocular movements intact.     Conjunctiva/sclera: Conjunctivae normal.  Cardiovascular:     Rate and Rhythm: Normal rate and regular rhythm.     Heart sounds:  Normal heart sounds.  Pulmonary:     Effort: Pulmonary effort is normal.     Breath sounds: Normal breath sounds. No wheezing or rales.  Musculoskeletal:        General: Normal range of motion.     Cervical back: Normal range of motion and neck supple.  Skin:    General: Skin is warm and dry.  Neurological:     Mental Status: She is alert and oriented to person, place, and time.  Psychiatric:        Mood and Affect: Mood normal.        Thought Content: Thought content normal.      UC Treatments / Results  Labs (all labs ordered are listed, but only abnormal results are displayed) Labs Reviewed  POC COVID19/FLU A&B COMBO    EKG   Radiology No results found.  Procedures Procedures (including critical care time)  Medications Ordered in UC Medications - No data to display  Initial Impression / Assessment and Plan / UC Course  I have reviewed the triage vital signs and the nursing notes.  Pertinent labs & imaging results that were available during my care of the patient were reviewed by me and considered in my medical decision making (see chart for details).     Vitals and exam very reassuring today, suspect viral respiratory infection.  Given exposures to influenza will treat with Tamiflu, Phenergan DM, Flonase.  Supportive home care and return precautions reviewed.  Final Clinical Impressions(s) / UC Diagnoses   Final diagnoses:  Viral URI with cough   Discharge Instructions   None    ED Prescriptions     Medication Sig Dispense Auth. Provider   oseltamivir (TAMIFLU) 75 MG capsule Take 1 capsule (75 mg total) by mouth every 12 (twelve) hours. 10 capsule Particia Nearing, [place]   promethazine-dextromethorphan (PROMETHAZINE-DM) 6.25-15 MG/5ML syrup Take 5 mLs by mouth 4 (four) times daily as needed. 100 mL Particia Nearing, PA-C   fluticasone [facility]) 50 MCG/ACT nasal spray Place 1 spray into both nostrils 2 (two) times daily. 16 g Particia Nearing, [place]      PDMP not reviewed this encounter.   Particia Nearing, [place] 08/07/23 1453

## 2023-08-07 NOTE — ED Triage Notes (Signed)
 Pt reports cough, sore throat, sneezing, intermittent body aches,chills since Friday.

## 2023-08-16 ENCOUNTER — Ambulatory Visit
Admission: RE | Admit: 2023-08-16 | Discharge: 2023-08-16 | Disposition: A | Discharge status: Home / Self Care | Payer: 59 | Source: Ambulatory Visit | Attending: Gastroenterology | Admitting: Gastroenterology

## 2023-08-16 DIAGNOSIS — R197 Diarrhea, unspecified: Secondary | ICD-10-CM | POA: Diagnosis not present

## 2023-08-16 DIAGNOSIS — K315 Obstruction of duodenum: Secondary | ICD-10-CM

## 2023-09-07 DIAGNOSIS — K5289 Other specified noninfective gastroenteritis and colitis: Secondary | ICD-10-CM | POA: Diagnosis not present

## 2023-09-07 DIAGNOSIS — R194 Change in bowel habit: Secondary | ICD-10-CM | POA: Diagnosis not present

## 2023-09-07 DIAGNOSIS — K5730 Diverticulosis of large intestine without perforation or abscess without bleeding: Secondary | ICD-10-CM | POA: Diagnosis not present

## 2023-09-08 DIAGNOSIS — J0100 Acute maxillary sinusitis, unspecified: Secondary | ICD-10-CM | POA: Diagnosis not present

## 2023-09-09 DIAGNOSIS — K5289 Other specified noninfective gastroenteritis and colitis: Secondary | ICD-10-CM | POA: Diagnosis not present

## 2023-09-22 DIAGNOSIS — M8588 Other specified disorders of bone density and structure, other site: Secondary | ICD-10-CM | POA: Diagnosis not present

## 2023-09-22 DIAGNOSIS — N958 Other specified menopausal and perimenopausal disorders: Secondary | ICD-10-CM | POA: Diagnosis not present

## 2023-10-24 DIAGNOSIS — Z1331 Encounter for screening for depression: Secondary | ICD-10-CM | POA: Diagnosis not present

## 2023-10-24 DIAGNOSIS — E663 Overweight: Secondary | ICD-10-CM | POA: Diagnosis not present

## 2023-10-24 DIAGNOSIS — I10 Essential (primary) hypertension: Secondary | ICD-10-CM | POA: Diagnosis not present

## 2023-10-24 DIAGNOSIS — G4700 Insomnia, unspecified: Secondary | ICD-10-CM | POA: Diagnosis not present

## 2023-10-24 DIAGNOSIS — Z0001 Encounter for general adult medical examination with abnormal findings: Secondary | ICD-10-CM | POA: Diagnosis not present

## 2023-10-24 DIAGNOSIS — Z6828 Body mass index (BMI) 28.0-28.9, adult: Secondary | ICD-10-CM | POA: Diagnosis not present

## 2023-10-24 DIAGNOSIS — F5101 Primary insomnia: Secondary | ICD-10-CM | POA: Diagnosis not present

## 2023-11-24 DIAGNOSIS — L821 Other seborrheic keratosis: Secondary | ICD-10-CM | POA: Diagnosis not present

## 2023-11-24 DIAGNOSIS — Z85820 Personal history of malignant melanoma of skin: Secondary | ICD-10-CM | POA: Diagnosis not present

## 2023-11-24 DIAGNOSIS — D2371 Other benign neoplasm of skin of right lower limb, including hip: Secondary | ICD-10-CM | POA: Diagnosis not present

## 2023-11-24 DIAGNOSIS — Z08 Encounter for follow-up examination after completed treatment for malignant neoplasm: Secondary | ICD-10-CM | POA: Diagnosis not present

## 2023-11-24 DIAGNOSIS — Z86006 Personal history of melanoma in-situ: Secondary | ICD-10-CM | POA: Diagnosis not present

## 2023-11-24 DIAGNOSIS — L813 Cafe au lait spots: Secondary | ICD-10-CM | POA: Diagnosis not present

## 2023-11-24 DIAGNOSIS — L814 Other melanin hyperpigmentation: Secondary | ICD-10-CM | POA: Diagnosis not present

## 2023-12-29 DIAGNOSIS — I10 Essential (primary) hypertension: Secondary | ICD-10-CM | POA: Diagnosis not present

## 2023-12-29 DIAGNOSIS — G4700 Insomnia, unspecified: Secondary | ICD-10-CM | POA: Diagnosis not present

## 2023-12-29 DIAGNOSIS — F329 Major depressive disorder, single episode, unspecified: Secondary | ICD-10-CM | POA: Diagnosis not present

## 2024-01-02 ENCOUNTER — Other Ambulatory Visit (HOSPITAL_BASED_OUTPATIENT_CLINIC_OR_DEPARTMENT_OTHER): Payer: Self-pay | Admitting: Family Medicine

## 2024-01-02 DIAGNOSIS — N390 Urinary tract infection, site not specified: Secondary | ICD-10-CM | POA: Diagnosis not present

## 2024-01-02 DIAGNOSIS — E782 Mixed hyperlipidemia: Secondary | ICD-10-CM

## 2024-01-02 DIAGNOSIS — N760 Acute vaginitis: Secondary | ICD-10-CM | POA: Diagnosis not present

## 2024-01-02 DIAGNOSIS — R300 Dysuria: Secondary | ICD-10-CM | POA: Diagnosis not present

## 2024-01-27 ENCOUNTER — Other Ambulatory Visit (HOSPITAL_BASED_OUTPATIENT_CLINIC_OR_DEPARTMENT_OTHER)

## 2024-01-31 ENCOUNTER — Ambulatory Visit (HOSPITAL_COMMUNITY)
Admission: RE | Admit: 2024-01-31 | Discharge: 2024-01-31 | Disposition: A | Discharge status: Home / Self Care | Payer: Self-pay | Source: Ambulatory Visit | Attending: Family Medicine | Admitting: Family Medicine

## 2024-01-31 DIAGNOSIS — E782 Mixed hyperlipidemia: Secondary | ICD-10-CM | POA: Insufficient documentation

## 2024-02-22 DIAGNOSIS — S91209A Unspecified open wound of unspecified toe(s) with damage to nail, initial encounter: Secondary | ICD-10-CM | POA: Diagnosis not present

## 2024-02-22 DIAGNOSIS — Z6828 Body mass index (BMI) 28.0-28.9, adult: Secondary | ICD-10-CM | POA: Diagnosis not present

## 2024-03-23 ENCOUNTER — Other Ambulatory Visit: Payer: Self-pay | Admitting: Obstetrics and Gynecology

## 2024-03-23 DIAGNOSIS — Z1231 Encounter for screening mammogram for malignant neoplasm of breast: Secondary | ICD-10-CM

## 2024-04-02 DIAGNOSIS — Z01419 Encounter for gynecological examination (general) (routine) without abnormal findings: Secondary | ICD-10-CM | POA: Diagnosis not present

## 2024-04-02 DIAGNOSIS — Z6829 Body mass index (BMI) 29.0-29.9, adult: Secondary | ICD-10-CM | POA: Diagnosis not present

## 2024-04-03 ENCOUNTER — Ambulatory Visit
Admission: RE | Admit: 2024-04-03 | Discharge: 2024-04-03 | Disposition: A | Source: Ambulatory Visit | Attending: Obstetrics and Gynecology | Admitting: Obstetrics and Gynecology

## 2024-04-03 DIAGNOSIS — Z1231 Encounter for screening mammogram for malignant neoplasm of breast: Secondary | ICD-10-CM

## 2024-05-06 DIAGNOSIS — S52612A Displaced fracture of left ulna styloid process, initial encounter for closed fracture: Secondary | ICD-10-CM | POA: Diagnosis not present

## 2024-05-06 DIAGNOSIS — S52592A Other fractures of lower end of left radius, initial encounter for closed fracture: Secondary | ICD-10-CM | POA: Diagnosis not present

## 2024-05-06 DIAGNOSIS — S52202A Unspecified fracture of shaft of left ulna, initial encounter for closed fracture: Secondary | ICD-10-CM | POA: Diagnosis not present

## 2024-05-06 DIAGNOSIS — S0101XA Laceration without foreign body of scalp, initial encounter: Secondary | ICD-10-CM | POA: Diagnosis not present

## 2024-05-06 DIAGNOSIS — M8580 Other specified disorders of bone density and structure, unspecified site: Secondary | ICD-10-CM | POA: Diagnosis not present

## 2024-05-06 DIAGNOSIS — S52572A Other intraarticular fracture of lower end of left radius, initial encounter for closed fracture: Secondary | ICD-10-CM | POA: Diagnosis not present

## 2024-05-06 DIAGNOSIS — S52692A Other fracture of lower end of left ulna, initial encounter for closed fracture: Secondary | ICD-10-CM | POA: Diagnosis not present

## 2024-05-06 DIAGNOSIS — M1812 Unilateral primary osteoarthritis of first carpometacarpal joint, left hand: Secondary | ICD-10-CM | POA: Diagnosis not present

## 2024-05-06 DIAGNOSIS — M542 Cervicalgia: Secondary | ICD-10-CM | POA: Diagnosis not present

## 2024-05-06 DIAGNOSIS — S52602A Unspecified fracture of lower end of left ulna, initial encounter for closed fracture: Secondary | ICD-10-CM | POA: Diagnosis not present

## 2024-05-06 DIAGNOSIS — S52502A Unspecified fracture of the lower end of left radius, initial encounter for closed fracture: Secondary | ICD-10-CM | POA: Diagnosis not present

## 2024-05-06 DIAGNOSIS — M19022 Primary osteoarthritis, left elbow: Secondary | ICD-10-CM | POA: Diagnosis not present

## 2024-05-06 DIAGNOSIS — M25522 Pain in left elbow: Secondary | ICD-10-CM | POA: Diagnosis not present

## 2024-05-06 DIAGNOSIS — S0003XA Contusion of scalp, initial encounter: Secondary | ICD-10-CM | POA: Diagnosis not present

## 2024-05-06 DIAGNOSIS — S62112A Displaced fracture of triquetrum [cuneiform] bone, left wrist, initial encounter for closed fracture: Secondary | ICD-10-CM | POA: Diagnosis not present

## 2024-05-08 DIAGNOSIS — G9389 Other specified disorders of brain: Secondary | ICD-10-CM | POA: Diagnosis not present

## 2024-05-08 DIAGNOSIS — W19XXXD Unspecified fall, subsequent encounter: Secondary | ICD-10-CM | POA: Diagnosis not present

## 2024-05-16 DIAGNOSIS — S52572A Other intraarticular fracture of lower end of left radius, initial encounter for closed fracture: Secondary | ICD-10-CM | POA: Diagnosis not present

## 2024-05-16 DIAGNOSIS — I10 Essential (primary) hypertension: Secondary | ICD-10-CM | POA: Diagnosis not present

## 2024-05-16 DIAGNOSIS — Z79899 Other long term (current) drug therapy: Secondary | ICD-10-CM | POA: Diagnosis not present

## 2024-05-16 DIAGNOSIS — G4733 Obstructive sleep apnea (adult) (pediatric): Secondary | ICD-10-CM | POA: Diagnosis not present

## 2024-05-16 NOTE — H&P (Signed)
 Operative H & P Update: The surgical history has been reviewed, remains accurate, and is without interval change. The patient's physiologic condition has not changed significantly since the last H&P (<30 days). The condition(s) persist for the listed plan, without new options for care. In addition, no new pharmacological alternative or therapy exist that would change the plan or its appropriateness. The patient and/or family understand the potential benefits and risks.  I discussed with the patient that during his or her surgery, the attending surgeon may be performing surgery on another patient in another operating room, and during that time, qualified residents or other medical professionals will perform non-critical portions of the surgery. The patient demonstrated understanding and acceptance of this.  Proceed with Procedure(s): OPEN REDUCTION INTERNAL FIXATION RADIUS

## 2024-06-08 NOTE — Telephone Encounter (Signed)
 I'm just seeing this. Was it addressed? [facility] Lynnea Bent, MD

## 2024-06-08 NOTE — Telephone Encounter (Signed)
 Spoke with pt, pt identifiers x2, states [facility] filled her prescription the next day without issue  Encouraged to call back with any additional questions or concerns.  Understanding and appreciation stated.

## 2024-06-18 ENCOUNTER — Ambulatory Visit (HOSPITAL_COMMUNITY): Attending: Orthopedic Surgery | Admitting: Occupational Therapy

## 2024-06-18 ENCOUNTER — Encounter (HOSPITAL_COMMUNITY): Payer: Self-pay | Admitting: Occupational Therapy

## 2024-06-18 DIAGNOSIS — M25632 Stiffness of left wrist, not elsewhere classified: Secondary | ICD-10-CM | POA: Diagnosis present

## 2024-06-18 DIAGNOSIS — M25532 Pain in left wrist: Secondary | ICD-10-CM | POA: Diagnosis present

## 2024-06-18 DIAGNOSIS — R29898 Other symptoms and signs involving the musculoskeletal system: Secondary | ICD-10-CM | POA: Insufficient documentation

## 2024-06-18 NOTE — Therapy (Signed)
 " OUTPATIENT OCCUPATIONAL THERAPY ORTHO EVALUATION  Patient Name: Amanda Wheeler MRN: [id number] DOB:15-Feb-1961, 64 y.o., female Today's Date: 06/18/2024  PCP: Leonce Lukes, PA-C REFERRING PROVIDER: Dow Donath, MD  END OF SESSION:  OT End of Session - 06/18/24 1535     Visit Number 1    Number of Visits 12    Date for Recertification  08/17/24    Authorization Type [employer], 25$ copay    Progress Note Due on Visit 10    OT Start Time 1136    OT Stop Time 1214    OT Time Calculation (min) 38 min    Activity Tolerance Patient tolerated treatment well    Behavior During Therapy WFL for tasks assessed/performed          Past Medical History:  Diagnosis Date   Acid reflux    Anxiety    Atypical nevus 09/16/2016   Left abdomen-mild   High cholesterol    Hypertension    Past Surgical History:  Procedure Laterality Date   CESAREAN SECTION     CHOLECYSTECTOMY     FRACTURE SURGERY Left    left arm   There are no active problems to display for this patient.   ONSET DATE: 05/16/24  REFERRING DIAG: L ORIF of the Distal Radius  THERAPY DIAG:  Pain in left wrist  Stiffness of left wrist, not elsewhere classified  Other symptoms and signs involving the musculoskeletal system  Rationale for Evaluation and Treatment: Rehabilitation  SUBJECTIVE:   SUBJECTIVE STATEMENT: I'm gonna have to have more surgery Pt accompanied by: self  PERTINENT HISTORY: Pt had a fall at Thanksgiving resulting in intraarticular radial fracture, now she is s/p ORIF, with plans to remove pins later in the year.  PRECAUTIONS: Other: wrist - see protocol  WEIGHT BEARING RESTRICTIONS: Yes >2#  PAIN:  Are you having pain? No  FALLS: Has patient fallen in last 6 months? Yes. Number of falls 1  PLOF: Independent  PATIENT GOALS: To get use back with decreased pain  NEXT MD VISIT: 07/09/24  OBJECTIVE:  Note: Objective measures were completed at Evaluation unless  otherwise noted.  HAND DOMINANCE: Right  ADLs: Overall ADLs: Pt is unable to manipulate buttons and zippers and clasps, She has max difficulty with doffing and donning pants and underwear. Pt has limited ability with bathing. At this time she is unable to cook or clean at all due to pain and limited motion.  UPPER EXTREMITY ROM:     Active ROM Left eval  Wrist flexion 0  Wrist extension 0  Wrist ulnar deviation 0  Wrist radial deviation 0  Wrist pronation 90  Wrist supination 70  (Blank rows = not tested)      06/18/24: able to make full fist  UPPER EXTREMITY MMT:     MMT Left eval  Wrist flexion   Wrist extension   Wrist ulnar deviation   Wrist radial deviation   Wrist pronation   Wrist supination   (Blank rows = not tested)  HAND FUNCTION: Grip strength: Right: 64 lbs; Left: 12 lbs, Lateral pinch: Right: 16 lbs, Left: 6 lbs, and 3 point pinch: Right: 14 lbs, Left: 4 lbs  COORDINATION: 9 Hole Peg test: Right: 19.93 sec; Left: 31.41 sec  SENSATION: Mild numbness and tingling through the palm and all fingers  EDEMA:  06/18/24:  R wrist: 15.5   L wrist: 16.9 R MCPs: 19.8   L MCPs: 18.8  OBSERVATIONS: moderate to severe fascial restrictions for  TREATMENT DATE:   06/18/24 -Digit ROM: composite flexion, abduction, finger taps, opposition, x10                                                                                                                              PATIENT EDUCATION: Education details: Digit ROM Person educated: Patient Education method: Explanation, Demonstration, and Handouts Education comprehension: verbalized understanding, returned demonstration, and needs further education  HOME EXERCISE PROGRAM: 06/18/24: Digit ROM  GOALS: Goals reviewed with patient? Yes  SHORT TERM GOALS: Target date: 08/17/24  Pt will be provided and educated on HEP for LUE in order to complete ADL's independently.   Goal status: INITIAL  2.  Pt will decrease  pain to 2/10 in LUE, in order to sleep for 3+ consecutive hours without waking due to pain.   Goal status: INITIAL  3.  Pt will decrease LUE fascial restrictions to minimal amounts or less in order to mobilize and manipulate objects during dressing and bathing to her maximum potential.   Goal status: INITIAL  4.  Pt will increase LUE grip strength by 15# and pinch strength by 2# in order to grasp and hold objects during cooking/meal prep tasks.   Goal status: INITIAL  5.  Pt will increase LUE coordination by completing 9 hole peg test in 24 or less in order to manipulate buttons and zippers independently.   Goal status: INITIAL   ASSESSMENT:  CLINICAL IMPRESSION: Patient is a 63 y.o. female  who was seen today for occupational therapy evaluation for s/p LUE weakness and stiffness following ORIF of distal radius. Pt presents with no mobility in her wrist, pain, swelling, weak grip, and decreased coordination.   PERFORMANCE DEFICITS: in functional skills including ADLs, IADLs, coordination, sensation, edema, tone, ROM, strength, fascial restrictions, Fine motor control, Gross motor control, and UE functional use.   IMPAIRMENTS: are limiting patient from ADLs, IADLs, rest and sleep, work, leisure, and social participation.   COMORBIDITIES: may have co-morbidities  that affects occupational performance. Patient will benefit from skilled OT to address above impairments and improve overall function.  MODIFICATION OR ASSISTANCE TO COMPLETE EVALUATION: Min-Moderate modification of tasks or assist with assess necessary to complete an evaluation.  OT OCCUPATIONAL PROFILE AND HISTORY: Detailed assessment: Review of records and additional review of physical, cognitive, psychosocial history related to current functional performance.  CLINICAL DECISION MAKING: Moderate - several treatment options, min-mod task modification necessary  REHAB POTENTIAL: Good  EVALUATION COMPLEXITY:  Moderate      PLAN:  OT FREQUENCY: 1-2x/week  OT DURATION: 6 weeks  PLANNED INTERVENTIONS: 97168 OT Re-evaluation, 97535 self care/ADL training, 02889 therapeutic exercise, 97530 therapeutic activity, 97112 neuromuscular re-education, 97140 manual therapy, 97035 ultrasound, 97018 paraffin, 02989 moist heat, 97010 cryotherapy, 97032 electrical stimulation (manual), passive range of motion, functional mobility training, energy conservation, coping strategies training, patient/family education, and DME and/or AE instructions  RECOMMENDED OTHER SERVICES: N/A  CONSULTED AND AGREED WITH PLAN OF CARE: Patient  PLAN FOR NEXT SESSION: Manual Therapy, stretching, digit ROM, theraputty  Valentin Nightingale, OTR/L [facility] [phone] Valentin Jillyn Nightingale, OT 06/18/2024, 3:38 PM   "

## 2024-06-18 NOTE — Patient Instructions (Addendum)

## 2024-06-20 ENCOUNTER — Ambulatory Visit (HOSPITAL_COMMUNITY): Admitting: Occupational Therapy

## 2024-06-26 ENCOUNTER — Ambulatory Visit (HOSPITAL_COMMUNITY): Admitting: Occupational Therapy

## 2024-06-29 ENCOUNTER — Encounter (HOSPITAL_COMMUNITY): Payer: Self-pay

## 2024-06-29 ENCOUNTER — Ambulatory Visit (HOSPITAL_COMMUNITY): Admitting: Occupational Therapy

## 2024-07-02 ENCOUNTER — Ambulatory Visit (HOSPITAL_COMMUNITY): Admitting: Occupational Therapy

## 2024-07-05 ENCOUNTER — Ambulatory Visit (HOSPITAL_COMMUNITY): Admitting: Occupational Therapy

## 2024-07-05 ENCOUNTER — Encounter (HOSPITAL_COMMUNITY): Payer: Self-pay

## 2024-07-09 ENCOUNTER — Ambulatory Visit (HOSPITAL_COMMUNITY): Admitting: Occupational Therapy

## 2024-07-10 ENCOUNTER — Ambulatory Visit (HOSPITAL_COMMUNITY): Admitting: Occupational Therapy

## 2024-07-12 ENCOUNTER — Ambulatory Visit (HOSPITAL_COMMUNITY): Admitting: Occupational Therapy

## 2024-07-16 ENCOUNTER — Ambulatory Visit (HOSPITAL_COMMUNITY): Payer: Self-pay | Admitting: Occupational Therapy

## 2024-07-19 ENCOUNTER — Ambulatory Visit (HOSPITAL_COMMUNITY): Admitting: Occupational Therapy

## 2024-07-23 ENCOUNTER — Ambulatory Visit (HOSPITAL_COMMUNITY): Admitting: Occupational Therapy

## 2024-07-26 ENCOUNTER — Ambulatory Visit (HOSPITAL_COMMUNITY): Admitting: Occupational Therapy

## 2024-07-30 ENCOUNTER — Ambulatory Visit (HOSPITAL_COMMUNITY): Admitting: Occupational Therapy

## 2024-08-02 ENCOUNTER — Ambulatory Visit (HOSPITAL_COMMUNITY): Admitting: Occupational Therapy

## 2024-08-06 ENCOUNTER — Ambulatory Visit (HOSPITAL_COMMUNITY): Admitting: Occupational Therapy

## 2024-08-09 ENCOUNTER — Ambulatory Visit (HOSPITAL_COMMUNITY): Admitting: Occupational Therapy

## 2024-08-13 ENCOUNTER — Ambulatory Visit (HOSPITAL_COMMUNITY): Admitting: Occupational Therapy

## 2024-08-16 ENCOUNTER — Ambulatory Visit (HOSPITAL_COMMUNITY): Admitting: Occupational Therapy

## 2024-11-26 ENCOUNTER — Ambulatory Visit: Admitting: Neurology
